# Patient Record
Sex: Male | Born: 1995 | Race: Black or African American | Hispanic: No | Marital: Single
Health system: Southern US, Community
[De-identification: ages and names within clinical notes are randomized; demographics above are authoritative.]

## PROBLEM LIST (undated history)

## (undated) DIAGNOSIS — Z9109 Other allergy status, other than to drugs and biological substances: Secondary | ICD-10-CM

## (undated) HISTORY — PX: TYMPANOSTOMY: SHX2586

---
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
## 2015-04-05 ENCOUNTER — Other Ambulatory Visit (HOSPITAL_COMMUNITY)
Admission: RE | Admit: 2015-04-05 | Discharge: 2015-04-05 | Disposition: A | Discharge status: Home / Self Care | Payer: Medicaid Other | Source: Ambulatory Visit | Attending: Emergency Medicine | Admitting: Emergency Medicine

## 2015-04-05 ENCOUNTER — Emergency Department (INDEPENDENT_AMBULATORY_CARE_PROVIDER_SITE_OTHER)
Admission: EM | Admit: 2015-04-05 | Discharge: 2015-04-05 | Disposition: A | Discharge status: Home / Self Care | Payer: Medicaid Other | Source: Home / Self Care | Attending: Emergency Medicine | Admitting: Emergency Medicine

## 2015-04-05 ENCOUNTER — Encounter (HOSPITAL_COMMUNITY): Payer: Self-pay | Admitting: Emergency Medicine

## 2015-04-05 DIAGNOSIS — J029 Acute pharyngitis, unspecified: Secondary | ICD-10-CM | POA: Insufficient documentation

## 2015-04-05 DIAGNOSIS — J302 Other seasonal allergic rhinitis: Secondary | ICD-10-CM | POA: Insufficient documentation

## 2015-04-05 LAB — POCT RAPID STREP A: Streptococcus, Group A Screen (Direct): NEGATIVE

## 2015-04-05 MED ORDER — AMOXICILLIN 500 MG PO CAPS: 500.0000 mg | ORAL_CAPSULE | Freq: Three times a day (TID) | ORAL | Status: AC

## 2015-04-05 MED ORDER — FLUTICASONE PROPIONATE 50 MCG/ACT NA SUSP: 2.0000 | Freq: Every day | NASAL | Status: AC

## 2015-04-05 MED ORDER — CETIRIZINE HCL 10 MG PO TABS
10.0000 mg | ORAL_TABLET | Freq: Every day | ORAL | Status: AC
Start: 2015-04-05 — End: ?

## 2015-04-05 NOTE — ED Notes (Signed)
Sore throat that started Friday.  Left ear fullness.  , runny nose, no stuffy nose, no vomiting, no diarrhea.

## 2015-04-05 NOTE — Discharge Instructions (Signed)
Please restart your allergy medicines. I have sent new prescriptions to the pharmacy. Take amoxicillin 3 times a day for the next 10 days. If your culture is negative in 2 days, we will call you so you can stop the antibiotic. You can use saltwater gargles, tea with honey, Chloraseptic spray, or Cepacol lozenges to help with the sore throat. Follow-up as needed.

## 2015-04-05 NOTE — ED Provider Notes (Signed)
CSN: [id number]     Arrival date & time 04/05/15  1840 History   First MD Initiated Contact with Patient 04/05/15 1915     Chief Complaint  Patient presents with  . Sore Throat   (Consider location/radiation/quality/duration/timing/severity/associated sxs/prior Treatment) HPI He is a 20 year old man here with his mom for evaluation of sore throat. He states his sore throat started 1-2 days ago. Was particularly bad yesterday. He reports pain with swallowing, but he is able to swallow food and liquids. This is associated with some runny nose. He also reports intermittent left ear discomfort. It is described as feeling clogged and will intermittently release.  Denies cough. No fevers or chills. No nausea or vomiting. He is supposed to be taking Zyrtec and nasal spray daily for allergies, but has not been for some time. He is in [place] in [place] and will be returning to school tomorrow.  Past Medical History  Diagnosis Date  . Environmental allergies    Past Surgical History  Procedure Laterality Date  . Tympanostomy     No family history on file. Social History  Substance Use Topics  . Smoking status: None  . Smokeless tobacco: None  . Alcohol Use: No    Review of Systems As in history of present illness Allergies  Review of patient's allergies indicates no known allergies.  Home Medications   Prior to Admission medications   Medication Sig Start Date End Date Taking? Authorizing Provider  amoxicillin (AMOXIL) 500 MG capsule Take 1 capsule (500 mg total) by mouth 3 (three) times daily. 04/05/15   Charm Rings, MD  cetirizine (ZYRTEC) 10 MG tablet Take 1 tablet (10 mg total) by mouth daily. 04/05/15   Charm Rings, MD  fluticasone (FLONASE) 50 MCG/ACT nasal spray Place 2 sprays into both nostrils daily. 04/05/15   Charm Rings, MD  ibuprofen (ADVIL,MOTRIN) 600 MG tablet Take 1 tablet (600 mg total) by mouth every 6 (six) hours as needed for mild pain. 05/24/13   Marcellina Millin, MD   OVER THE COUNTER MEDICATION Place 2 drops into the right eye 2 (two) times daily.    Historical Provider, MD   Meds Ordered and Administered this Visit  Medications - No data to display  BP 124/71 mmHg  Pulse 78  Temp(Src) 97.8 F (36.6 C) (Oral)  Resp 12  SpO2 100% No data found.   Physical Exam  Constitutional: He is oriented to person, place, and time. He appears well-developed and well-nourished. No distress.  HENT:  Mouth/Throat: No oropharyngeal exudate.  Oropharynx is quite erythematous. Nasal mucosa slightly edematous. TMs normal bilaterally.  Neck: Neck supple.  Cardiovascular: Normal rate, regular rhythm and normal heart sounds.   No murmur heard. Pulmonary/Chest: Effort normal and breath sounds normal. No respiratory distress. He has no wheezes. He has no rales.  Lymphadenopathy:    He has no cervical adenopathy.  Neurological: He is alert and oriented to person, place, and time.    ED Course  Procedures (including critical care time)  Labs Review Labs Reviewed  POCT RAPID STREP A    Imaging Review No results found.   MDM   1. Pharyngitis   2. Seasonal allergies    Rapid strep is negative.  I am still concerned clinically for strep. Given that he is leaving for school tomorrow, will start presumptively on antibiotics while awaiting culture. Also provided prescriptions for his allergy medications and strongly encouraged him to restart these immediately. Follow-up as needed.  Charm RingsErin J Laverne Klugh, MD 04/05/15 2004

## 2015-04-08 LAB — CULTURE, GROUP A STREP (THRC)

## 2015-04-11 ENCOUNTER — Telehealth (HOSPITAL_COMMUNITY): Payer: Self-pay | Admitting: Emergency Medicine

## 2015-04-11 NOTE — ED Notes (Signed)
x1 attempt  LM on pt's VM [phone] Need to give lab results from recent visit on 3/4  Per Dr. Dayton ScrapeMurray,  Throat cx growing a few strep. [facility]; finish amoxicillin. Recheck for persistent symptoms. LM  Will try later.

## 2015-04-16 NOTE — ED Notes (Signed)
Called pt and notified of recent lab results from visit 3/4 Pt ID'd properly... Reports feeling better and sx have subsided but has not finished antibiotics Adv pt to finish full treatment   Per Dr. Dayton ScrapeMurray,  Throat cx growing a few strep. [facility]; finish amoxicillin. Recheck for persistent symptoms. LM  Adv pt if sx are not getting better to return  Pt verb understanding

## 2015-06-02 DEATH — deceased

## 2015-11-04 IMAGING — CR DG PELVIS 1-2V
1 series · 1 of 1 positions shown · non-contrast
Comparison: None.

CLINICAL DATA: Groin injury 6 days ago. Severe right anterior groin
pain with limited range of motion of the right leg.

EXAM:
PELVIS - 1-2 VIEW

[t pelvis ap]
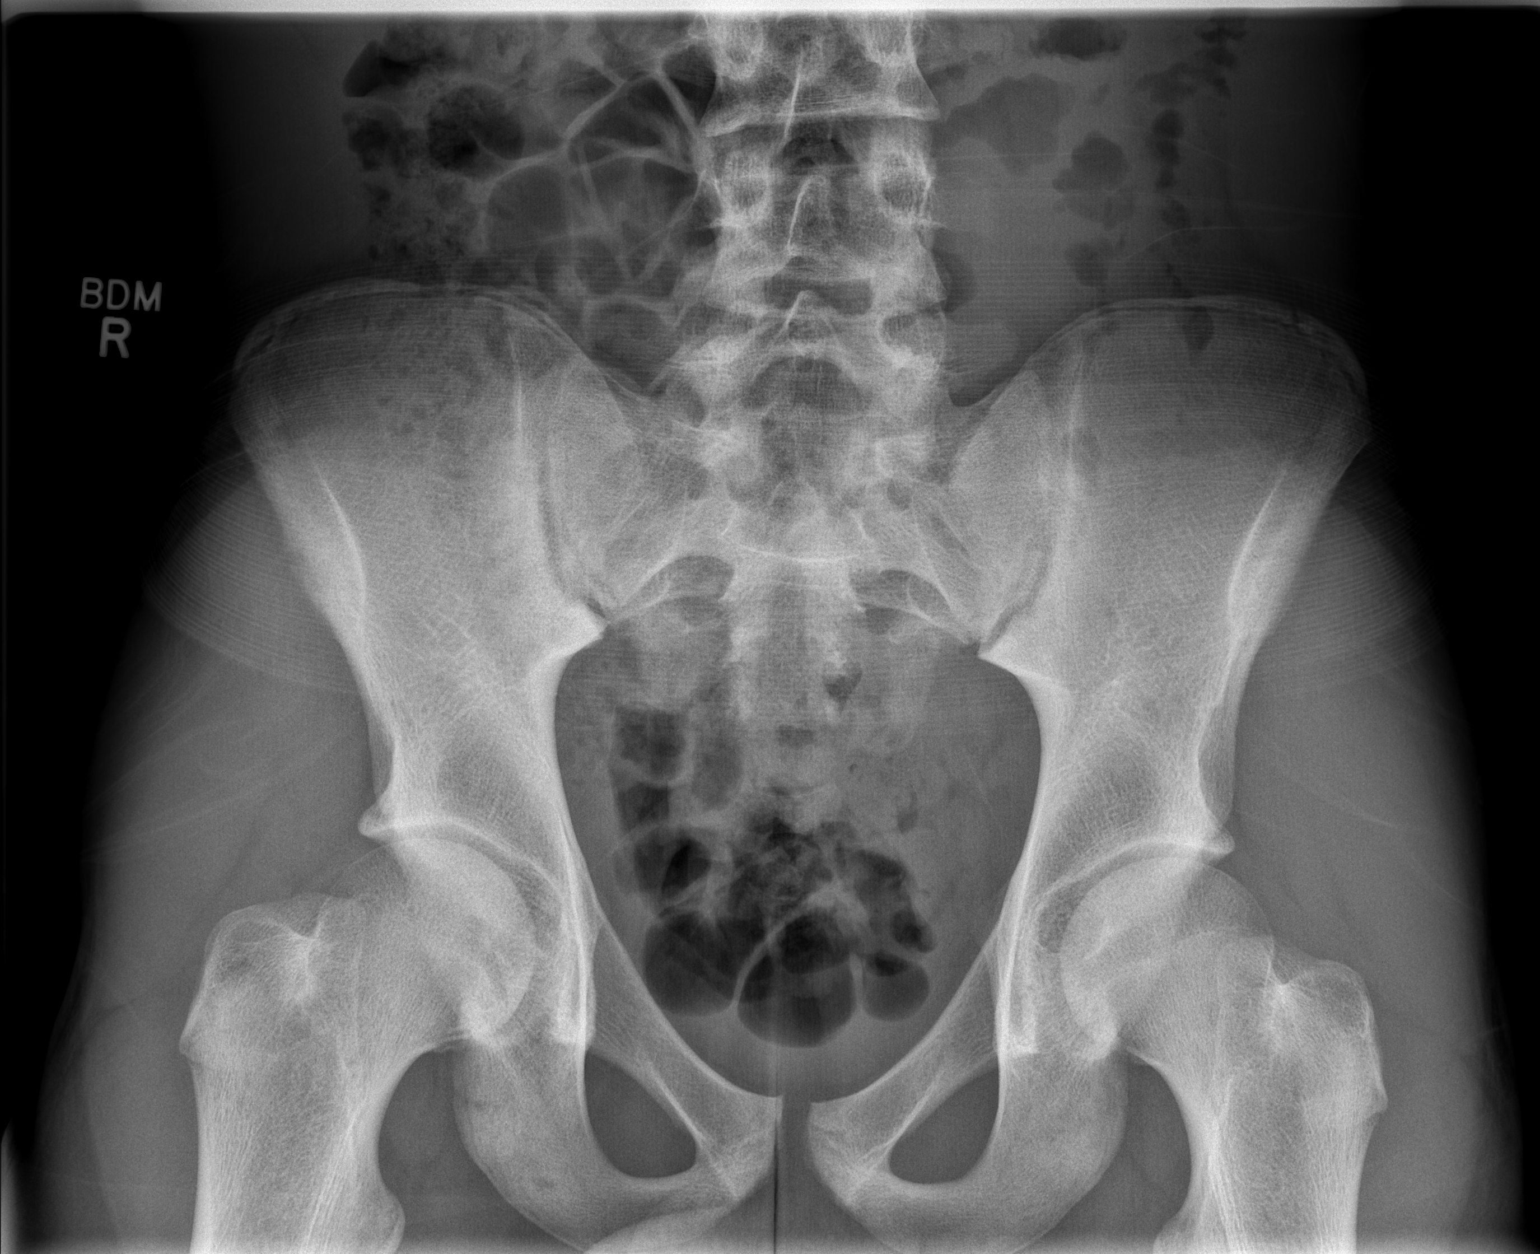

[1 of 1 positions shown; findings below may reference images not displayed]

FINDINGS: There is no evidence of pelvic fracture or diastasis. No other
pelvic bone lesions are seen.
IMPRESSION: Negative.
